# Patient Record
Sex: Male | Born: 2000 | Hispanic: No | Marital: Single
Health system: Southern US, Community
[De-identification: ages and names within clinical notes are randomized; demographics above are authoritative.]

## PROBLEM LIST (undated history)

## (undated) DIAGNOSIS — E669 Obesity, unspecified: Secondary | ICD-10-CM

## (undated) DIAGNOSIS — J45909 Unspecified asthma, uncomplicated: Secondary | ICD-10-CM

## (undated) HISTORY — DX: Obesity, unspecified: E66.9

---
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
## 2020-01-10 DIAGNOSIS — H5213 Myopia, bilateral: Secondary | ICD-10-CM | POA: Diagnosis not present

## 2020-03-06 DIAGNOSIS — Z23 Encounter for immunization: Secondary | ICD-10-CM | POA: Diagnosis not present

## 2020-03-27 DIAGNOSIS — Z23 Encounter for immunization: Secondary | ICD-10-CM | POA: Diagnosis not present

## 2020-09-03 ENCOUNTER — Other Ambulatory Visit: Payer: Self-pay

## 2020-09-03 ENCOUNTER — Other Ambulatory Visit (HOSPITAL_COMMUNITY)
Admission: RE | Admit: 2020-09-03 | Discharge: 2020-09-03 | Disposition: A | Discharge status: Home / Self Care | Payer: Medicaid Other | Source: Ambulatory Visit | Attending: Pediatrics | Admitting: Pediatrics

## 2020-09-03 ENCOUNTER — Ambulatory Visit (INDEPENDENT_AMBULATORY_CARE_PROVIDER_SITE_OTHER): Payer: Medicaid Other | Admitting: Pediatrics

## 2020-09-03 VITALS — BP 118/82 | HR 63 | Ht 66.0 in | Wt 196.6 lb

## 2020-09-03 DIAGNOSIS — E669 Obesity, unspecified: Secondary | ICD-10-CM | POA: Diagnosis not present

## 2020-09-03 DIAGNOSIS — E559 Vitamin D deficiency, unspecified: Secondary | ICD-10-CM | POA: Diagnosis not present

## 2020-09-03 DIAGNOSIS — Z114 Encounter for screening for human immunodeficiency virus [HIV]: Secondary | ICD-10-CM

## 2020-09-03 DIAGNOSIS — Z6831 Body mass index (BMI) 31.0-31.9, adult: Secondary | ICD-10-CM | POA: Diagnosis not present

## 2020-09-03 DIAGNOSIS — E661 Drug-induced obesity: Secondary | ICD-10-CM

## 2020-09-03 DIAGNOSIS — Z0001 Encounter for general adult medical examination with abnormal findings: Secondary | ICD-10-CM

## 2020-09-03 DIAGNOSIS — E6609 Other obesity due to excess calories: Secondary | ICD-10-CM | POA: Diagnosis not present

## 2020-09-03 DIAGNOSIS — R7401 Elevation of levels of liver transaminase levels: Secondary | ICD-10-CM

## 2020-09-03 DIAGNOSIS — Z23 Encounter for immunization: Secondary | ICD-10-CM

## 2020-09-03 DIAGNOSIS — E781 Pure hyperglyceridemia: Secondary | ICD-10-CM

## 2020-09-03 DIAGNOSIS — L309 Dermatitis, unspecified: Secondary | ICD-10-CM | POA: Diagnosis not present

## 2020-09-03 DIAGNOSIS — I10 Essential (primary) hypertension: Secondary | ICD-10-CM | POA: Diagnosis not present

## 2020-09-03 DIAGNOSIS — L83 Acanthosis nigricans: Secondary | ICD-10-CM | POA: Diagnosis not present

## 2020-09-03 DIAGNOSIS — Z113 Encounter for screening for infections with a predominantly sexual mode of transmission: Secondary | ICD-10-CM | POA: Diagnosis not present

## 2020-09-03 LAB — POCT RAPID HIV: Rapid HIV, POC: NEGATIVE

## 2020-09-03 MED ORDER — TRIAMCINOLONE ACETONIDE 0.1 % EX OINT: 1.0000 "application " | TOPICAL_OINTMENT | Freq: Two times a day (BID) | CUTANEOUS | 5 refills | Status: DC

## 2020-09-03 MED ORDER — MUPIROCIN 2 % EX OINT: 1.0000 "application " | TOPICAL_OINTMENT | Freq: Two times a day (BID) | CUTANEOUS | 1 refills | Status: DC

## 2020-09-03 NOTE — Progress Notes (Signed)
Adolescent Well Care Visit Justin [name] is a 19 y.o. male who is here for well care.    PCP:  Clifton Custard, MD   History was provided by the patient.  Current Issues: Current concerns include dry skin on hands, use lotion without improvement. No meds tried for this.  Hypertension - not taking lisinopril for the past year. 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[employer])  Nutrition/Exercise: Nutrition/Eating Behaviors: trying to eat smaller portions, doesn't eat many fruits/veggies, eating 3 times per day Supplements/ Vitamins: no Play any Sports?/ Exercise: going to the gym a few times each week  Sleep:  Sleep: sleeps well at night , denies snoring or night-time waking, sleeping during the day due to work  Social Screening: Lives with:  Mother, brother, and sister Parental relations:  good with mom, dad is not involed Activities, Work, and Regulatory affairs officer?: working at [employer] shift Concerns regarding behavior with peers?  no Stressors of note: no  Education: Not in school, graduated high school.  Thinking about community college or real estate classes.  Confidential Social History: Tobacco?  no Secondhand smoke exposure?  no Drugs/ETOH?  no  Sexually Active?  no   Pregnancy Prevention: abstinence - discussed condoms today  Screenings: Patient has a dental home: yes  The patient completed the Rapid Assessment for Adolescent Preventive Services screening questionnaire and the following topics were identified as risk factors and discussed: healthy eating  In addition, the following topics were discussed as part of anticipatory guidance healthy eating, exercise, tobacco use, marijuana use, drug use, condom use and school problems.  PHQ-9 completed and results indicated no signs of depression  Physical Exam:  Vitals:   09/03/20 1132  BP: 118/82  Pulse: 63  Weight: 196 lb 9.6 oz (89.2 kg)  Height: 5\' 6"  (1.676 m)   BP 118/82 (BP Location: Left Arm,  Patient Position: Sitting)   Pulse 63   Ht 5\' 6"  (1.676 m)   Wt 196 lb 9.6 oz (89.2 kg)   BMI 31.73 kg/m  Body mass index: body mass index is 31.73 kg/m.    Hearing Screening   Method: Audiometry   125Hz  250Hz  500Hz  1000Hz  2000Hz  3000Hz  4000Hz  6000Hz  8000Hz   Right ear:   20 20 20  20     Left ear:   20 20 20  20       Visual Acuity Screening   Right eye Left eye Both eyes  Without correction:     With correction: 20/16 20/16 20/16     General Appearance:   alert, oriented, no acute distress  HENT: Normocephalic, no obvious abnormality, conjunctiva clear  Mouth:   Normal appearing teeth, no obvious discoloration, dental caries, or dental caps  Neck:   Supple; thyroid: no enlargement, symmetric, no tenderness/mass/nodules  Chest Normal male  Lungs:   Clear to auscultation bilaterally, normal work of breathing  Heart:   Regular rate and rhythm, S1 and S2 normal, no murmurs;   Abdomen:   Soft, non-tender, no mass, or organomegaly  GU normal male genitals, no testicular masses or hernia, Tanner stage V  Musculoskeletal:   Tone and strength strong and symmetrical, all extremities               Lymphatic:   No cervical adenopathy  Skin/Hair/Nails:   Skin warm, dry and intact, no rashes, no bruises or petechiae, thickened hyperpigmented skin on posterior neck, erythematous dry and peeling patches on the dorsum of the left hand, milder dry pinking  patches on the right hand  Neurologic:   Strength, gait, and coordination normal and age-appropriate     Assessment and Plan:   1. Encounter for general adult medical examination with abnormal findings  2. Class 1 obesity due to excess calories with serious comorbidity and body mass index (BMI) of 31.0 to 31.9 in adult Patient with history of transaminitis likely due to fatty liver and hypertension.  Labs obtained to screen for additional obesity-related comorbidities.  BMI has decreased to 31.73 from 33 due to modest weight loss in the  setting of increased physical activity.   5-2-1-0 goals of healthy active living reviewed. - TSH - Hemoglobin A1c  3. Routine screening for STI (sexually transmitted infection) Patient denies sexual activity - at risk age group.  - POCT Rapid HIV - negative - Urine cytology ancillary only  4.Primary hypertension Diastolic BP is mildly elevated today but significantly improved from prior.  Will hold off on restarting meds today.  I provided dietary counseling - increase water intake, decrease caffeine intake, increase fruits/veggies, decrease sodium intake.  - TSH - Ambulatory referral to Internal Medicine  5. Acanthosis nigricans Noted again on exam.  Due for annual HgbA1C today. - Hemoglobin A1c - Ambulatory referral to Internal Medicine  7. Transaminitis History of elevated AST and ALT on last check 2 years ago - likely due to fatty liver.  Will repeat labs today - Comprehensive metabolic panel - Gamma GT  8. Hypovitaminosis D History of very low vitamin D level, no recent supplementation. - VITAMIN D 25 Hydroxy (Vit-D Deficiency, Fractures)  9. Hypertriglyceridemia Due for repeat fasting labs today.   - Lipid panel (fasting)  10. Eczema of left hand Discussed supportive care with hypoallergenic soap/detergent and regular application of bland emollients.  Reviewed appropriate use of steroid creams and return precautions. - triamcinolone ointment (KENALOG) 0.1 %; Apply 1 application topically 2 (two) times daily. For dry skin on hands.  Dispense: 60 g; Refill: 5 - mupirocin ointment (BACTROBAN) 2 %; Apply 1 application topically 2 (two) times daily. For skin infection  Dispense: 22 g; Refill: 1  Hearing screening result:normal Vision screening result: normal  Counseling provided for all of the vaccine components  Orders Placed This Encounter  Procedures  . Flu Vaccine QUAD 36+ mos IM     Return if symptoms worsen or fail to improve, for Will schedule new patient  appointment with [facility].Clifton Custard, MD

## 2020-09-03 NOTE — Patient Instructions (Signed)
@  CurDate@ @PATIENTFIRSTNAME @ @PATIENTLASTNAME @ 05-30-2001   Dear 04/25/2001,  As your medical provider, it is important to me that you continue to receive high-quality primary care services as you transition to adulthood.  After the age of 68, you can no longer be seen at the [facility] for your primary care health services.   Below is a list of adult medicine practices that are currently accepting new patients.  Please reach out to one of these practices to schedule a new patient appointment as soon as possible.  Please be aware that you will not be able to be seen at my office after your 22nd birthday.  Sincerely, [facility], MD  [facility]    Adult Primary Care Clinics Name Criteria Services   [facility]  Address: [address] [place], [address] [place]  Phone: [phone] Hours: Monday - Friday 9 AM -6 PM  Types of insurance accepted:  Saturday Saturday . [facility] (orange card) . Medicaid . Medicare . Uninsured  Language services:  Nurse, learning disability Video and phone interpreters available   Ages 58 and older    . Adult primary care . Onsite pharmacy . Integrated behavioral health . Financial assistance counseling . Walk-in hours for established patients  Financial assistance counseling hours: Tuesdays 2:00PM - 5:00PM  Thursday 8:30AM - 4:30PM  Space is limited, 10 on Tuesday and 20 on Thursday on a first come, first serve basis  Name Criteria Services   [facility]  Address: [address] [place], [address] [place]  Phone: [phone]  Hours: Monday - Friday 8:30 AM - 5 PM  Types of insurance accepted:  Saturday Thursday . Medicaid . Medicare . Uninsured  Language services:  Marland Kitchen Video and phone interpreters available   All ages - newborn to adult    . Primary care for all ages (children and adults) . Integrated behavioral health . Nutritionist . Financial assistance counseling

## 2020-09-04 LAB — LIPID PANEL
Cholesterol: 161 mg/dL (ref <170)
HDL: 43 mg/dL — ABNORMAL LOW (ref >45)
LDL Cholesterol (Calc): 90 mg/dL (calc) (ref <110)
Non-HDL Cholesterol (Calc): 118 mg/dL (calc) (ref <120)
Total CHOL/HDL Ratio: 3.7 (calc) (ref <5.0)
Triglycerides: 189 mg/dL — ABNORMAL HIGH (ref <90)

## 2020-09-04 LAB — COMPREHENSIVE METABOLIC PANEL
AG Ratio: 1.7 (calc) (ref 1.0–2.5)
ALT: 35 U/L (ref 8–46)
AST: 27 U/L (ref 12–32)
Albumin: 4.6 g/dL (ref 3.6–5.1)
Alkaline phosphatase (APISO): 88 U/L (ref 46–169)
BUN: 17 mg/dL (ref 7–20)
CO2: 25 mmol/L (ref 20–32)
Calcium: 9.3 mg/dL (ref 8.9–10.4)
Chloride: 104 mmol/L (ref 98–110)
Creat: 0.79 mg/dL (ref 0.60–1.26)
Globulin: 2.7 g/dL (calc) (ref 2.1–3.5)
Glucose, Bld: 83 mg/dL (ref 65–99)
Potassium: 4.4 mmol/L (ref 3.8–5.1)
Sodium: 140 mmol/L (ref 135–146)
Total Bilirubin: 0.3 mg/dL (ref 0.2–1.1)
Total Protein: 7.3 g/dL (ref 6.3–8.2)

## 2020-09-04 LAB — HEMOGLOBIN A1C
Hgb A1c MFr Bld: 5.2 % of total Hgb (ref <5.7)
Mean Plasma Glucose: 103 (calc)
eAG (mmol/L): 5.7 (calc)

## 2020-09-04 LAB — GAMMA GT: GGT: 25 U/L (ref 9–31)

## 2020-09-04 LAB — URINE CYTOLOGY ANCILLARY ONLY
Chlamydia: NEGATIVE
Comment: NEGATIVE
Comment: NORMAL
Neisseria Gonorrhea: NEGATIVE

## 2020-09-04 LAB — TSH: TSH: 3.34 mIU/L (ref 0.50–4.30)

## 2020-09-04 LAB — VITAMIN D 25 HYDROXY (VIT D DEFICIENCY, FRACTURES): Vit D, 25-Hydroxy: 10 ng/mL — ABNORMAL LOW (ref 30–100)

## 2020-09-09 ENCOUNTER — Other Ambulatory Visit: Payer: Self-pay | Admitting: Pediatrics

## 2020-09-09 DIAGNOSIS — E559 Vitamin D deficiency, unspecified: Secondary | ICD-10-CM

## 2020-09-09 MED ORDER — VITAMIN D (ERGOCALCIFEROL) 1.25 MG (50000 UNIT) PO CAPS: 50000.0000 [IU] | ORAL_CAPSULE | ORAL | 0 refills | Status: AC

## 2020-09-09 NOTE — Progress Notes (Signed)
Rx for weekly vitamin D supplementation.

## 2020-09-09 NOTE — Progress Notes (Signed)
I called and discussed Justin [name]'s lab results with him.  Vitamin D level is very low - Rx ergocalciferol 50,000 IU once weekly x 8 weeks and then start daily 1000 IU vitamin D after weekly supplementation.  Repeat vitamin D level with new adult PCP in 2-3 months.  Other labs are normal except for mildly low HDL and mildly elevated triglycerides - no change from prior.

## 2020-10-03 ENCOUNTER — Emergency Department (HOSPITAL_BASED_OUTPATIENT_CLINIC_OR_DEPARTMENT_OTHER)
Admission: EM | Admit: 2020-10-03 | Discharge: 2020-10-03 | Disposition: A | Discharge status: Home / Self Care | Payer: No Typology Code available for payment source | Attending: Emergency Medicine | Admitting: Emergency Medicine

## 2020-10-03 ENCOUNTER — Emergency Department (HOSPITAL_BASED_OUTPATIENT_CLINIC_OR_DEPARTMENT_OTHER): Payer: No Typology Code available for payment source

## 2020-10-03 ENCOUNTER — Encounter (HOSPITAL_BASED_OUTPATIENT_CLINIC_OR_DEPARTMENT_OTHER): Payer: Self-pay | Admitting: Emergency Medicine

## 2020-10-03 ENCOUNTER — Other Ambulatory Visit: Payer: Self-pay

## 2020-10-03 DIAGNOSIS — Z23 Encounter for immunization: Secondary | ICD-10-CM | POA: Diagnosis not present

## 2020-10-03 DIAGNOSIS — J45909 Unspecified asthma, uncomplicated: Secondary | ICD-10-CM | POA: Diagnosis not present

## 2020-10-03 DIAGNOSIS — Z7722 Contact with and (suspected) exposure to environmental tobacco smoke (acute) (chronic): Secondary | ICD-10-CM | POA: Diagnosis not present

## 2020-10-03 DIAGNOSIS — W231XXA Caught, crushed, jammed, or pinched between stationary objects, initial encounter: Secondary | ICD-10-CM | POA: Diagnosis not present

## 2020-10-03 DIAGNOSIS — Y990 Civilian activity done for income or pay: Secondary | ICD-10-CM | POA: Insufficient documentation

## 2020-10-03 DIAGNOSIS — S67193A Crushing injury of left middle finger, initial encounter: Secondary | ICD-10-CM | POA: Diagnosis not present

## 2020-10-03 DIAGNOSIS — S6710XA Crushing injury of unspecified finger(s), initial encounter: Secondary | ICD-10-CM

## 2020-10-03 MED ORDER — TETANUS-DIPHTH-ACELL PERTUSSIS 5-2.5-18.5 LF-MCG/0.5 IM SUSY
0.5000 mL | PREFILLED_SYRINGE | Freq: Once | INTRAMUSCULAR | Status: AC
  Administered 2020-10-03: 05:00:00 0.5 mL via INTRAMUSCULAR
  Filled 2020-10-03: qty 0.5

## 2020-10-03 MED ORDER — NAPROXEN 250 MG PO TABS
500.0000 mg | ORAL_TABLET | Freq: Once | ORAL | Status: AC
  Administered 2020-10-03: 05:00:00 500 mg via ORAL
  Filled 2020-10-03: qty 2

## 2020-10-03 NOTE — ED Provider Notes (Signed)
[facility] Provider Note: Lowella Dell, MD, FACEP  CSN: [id number] MRN: [id number] ARRIVAL: 10/03/20 at 0352 ROOM: MH01/MH01   CHIEF COMPLAINT  Finger Injury   HISTORY OF PRESENT ILLNESS  10/03/20 4:05 AM Justin [name] is a 19 y.o. male who works at [employer].  He got his left middle finger crushed between 2 pieces of metal at work just prior to arrival.  He is having pain in his left middle finger distal phalanx.  He rates the pain as a 5 out of 10, worse with palpation or movement.  There is an abrasion to the pad of the affected finger.  Sensation is intact in the fingertip and he is able to flex and extend the finger at the DIP and PIP joints.  Tetanus is not up-to-date.   Past Medical History:  Diagnosis Date   Asthma    Obesity, unspecified 06/05/2014    History reviewed. No pertinent surgical history.  No family history on file.  Social History   Tobacco Use   Smoking status: Passive Smoke Exposure - Never Smoker   Smokeless tobacco: Never Used  Substance Use Topics   Alcohol use: No   Drug use: No    Prior to Admission medications   Medication Sig Start Date End Date Taking? Authorizing Provider  mupirocin ointment (BACTROBAN) 2 % Apply 1 application topically 2 (two) times daily. For skin infection 09/03/20   Ettefagh, Aron Baba, MD  Vitamin D, Ergocalciferol, (DRISDOL) 1.25 MG (50000 UNIT) CAPS capsule Take 1 capsule (50,000 Units total) by mouth once a week. 09/09/20   Ettefagh, Aron Baba, MD    Allergies Patient has no known allergies.   REVIEW OF SYSTEMS  Negative except as noted here or in the History of Present Illness.   PHYSICAL EXAMINATION  Initial Vital Signs Blood pressure (!) 165/81, pulse 65, temperature 98.4 F (36.9 C), temperature source Oral, resp. rate 18, height 5\' 5"  (1.651 m), weight 88.5 kg, SpO2 99 %.  Examination General: Well-developed, well-nourished male in no acute distress; appearance consistent with age  of record HENT: normocephalic; atraumatic Eyes: Normal appearance Neck: supple Heart: regular rate and rhythm Lungs: clear to auscultation bilaterally Abdomen: soft; nondistended; nontender; bowel sounds present Extremities: No deformity; full range of motion; tenderness and volar abrasion of distal phalanx of left middle finger, finger pad soft, sensation and tendon function intact Neurologic: Awake, alert and oriented; motor function intact in all extremities and symmetric; no facial droop Skin: Warm and dry Psychiatric: Normal mood and affect   RESULTS  Summary of this visit's results, reviewed and interpreted by myself:   EKG Interpretation  Date/Time:    Ventricular Rate:    PR Interval:    QRS Duration:   QT Interval:    QTC Calculation:   R Axis:     Text Interpretation:        Laboratory Studies: No results found for this or any previous visit (from the past 24 hour(s)). Imaging Studies: DG Finger Middle Left  Result Date: 10/03/2020 CLINICAL DATA:  Crushing injury of the left middle finger EXAM: LEFT MIDDLE FINGER 2+V COMPARISON:  None. FINDINGS: Mild soft tissue swelling of the third digit most pronounced along the dorsal aspect of the distal interphalangeal joint. No soft tissue gas or foreign body. No acute bony abnormality. Specifically, no fracture, subluxation, or dislocation. IMPRESSION: Mild soft tissue swelling, most focally adjacent the dorsal aspect of the distal third interphalangeal joint. No acute osseous abnormality. Electronically Signed  By: Kreg Shropshire M.D.   On: 10/03/2020 04:15    ED COURSE and MDM  Nursing notes, initial and subsequent vitals signs, including pulse oximetry, reviewed and interpreted by myself.  Vitals:   10/03/20 0400  BP: (!) 165/81  Pulse: 65  Resp: 18  Temp: 98.4 F (36.9 C)  TempSrc: Oral  SpO2: 99%  Weight: 88.5 kg  Height: 5\' 5"  (1.651 m)   Medications  Tdap (BOOSTRIX) injection 0.5 mL (has no administration  in time range)  naproxen (NAPROSYN) tablet 500 mg (has no administration in time range)   Will treat with local wound care.  No evidence of fracture on radiograph or compartment syndrome on examination.  Tetanus updated.   PROCEDURES  Procedures   ED DIAGNOSES     ICD-10-CM   1. Crushing injury of finger, initial encounter  S67.10XA        Tauri Ethington, , MD 10/03/20 [phone]

## 2020-10-03 NOTE — ED Triage Notes (Signed)
Crush type injury to left middle finger by metal machine at work. Pt has ice on finger on arrival.

## 2020-10-27 ENCOUNTER — Other Ambulatory Visit: Payer: Self-pay | Admitting: Pediatrics

## 2020-10-27 DIAGNOSIS — E559 Vitamin D deficiency, unspecified: Secondary | ICD-10-CM

## 2020-10-29 ENCOUNTER — Ambulatory Visit (INDEPENDENT_AMBULATORY_CARE_PROVIDER_SITE_OTHER): Payer: Medicaid Other | Admitting: Pediatrics

## 2020-10-29 ENCOUNTER — Encounter: Payer: Self-pay | Admitting: Pediatrics

## 2020-10-29 ENCOUNTER — Other Ambulatory Visit: Payer: Self-pay

## 2020-10-29 VITALS — BP 128/80 | HR 67 | Temp 97.2°F | Ht 65.0 in | Wt 191.8 lb

## 2020-10-29 DIAGNOSIS — J018 Other acute sinusitis: Secondary | ICD-10-CM

## 2020-10-29 DIAGNOSIS — R509 Fever, unspecified: Secondary | ICD-10-CM | POA: Diagnosis not present

## 2020-10-29 LAB — POC SOFIA SARS ANTIGEN FIA: SARS:: NEGATIVE

## 2020-10-29 MED ORDER — AMOXICILLIN 875 MG PO TABS
875.0000 mg | ORAL_TABLET | Freq: Two times a day (BID) | ORAL | 0 refills | Status: AC
Start: 2020-10-29 — End: 2020-11-05
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
## 2020-10-29 NOTE — Patient Instructions (Signed)

## 2020-11-30 ENCOUNTER — Other Ambulatory Visit: Payer: Self-pay

## 2020-11-30 ENCOUNTER — Ambulatory Visit (INDEPENDENT_AMBULATORY_CARE_PROVIDER_SITE_OTHER): Payer: 59

## 2020-11-30 DIAGNOSIS — Z23 Encounter for immunization: Secondary | ICD-10-CM

## 2020-11-30 NOTE — Progress Notes (Signed)
   Covid-19 Vaccination Clinic  Name:  Justin [name]    MRN: [id number] DOB: 10-05-2001  11/30/2020  Mr. [name] was observed post Covid-19 immunization for 15 minutes without incident. He was provided with Vaccine Information Sheet and instruction to access the V-Safe system.   Mr. [name] was instructed to call 911 with any severe reactions post vaccine: Marland Kitchen Difficulty breathing  . Swelling of face and throat  . A fast heartbeat  . A bad rash all over body  . Dizziness and weakness   Immunizations Administered    Name Date Dose VIS Date Route   PFIZER Comrnaty(Gray TOP) Covid-19 Vaccine 11/30/2020  9:30 AM 0.3 mL 09/19/2020 Intramuscular   Manufacturer: [employer], [employer]   Lot: [id number]   NDC: [phone]

## 2021-01-30 DIAGNOSIS — H538 Other visual disturbances: Secondary | ICD-10-CM | POA: Diagnosis not present

## 2022-05-20 IMAGING — DX DG FINGER MIDDLE 2+V*L*
3 series · 3 of 3 positions shown · non-contrast
Comparison: None.

CLINICAL DATA: Crushing injury of the left middle finger

EXAM:
LEFT MIDDLE FINGER 2+V

[finger ap]
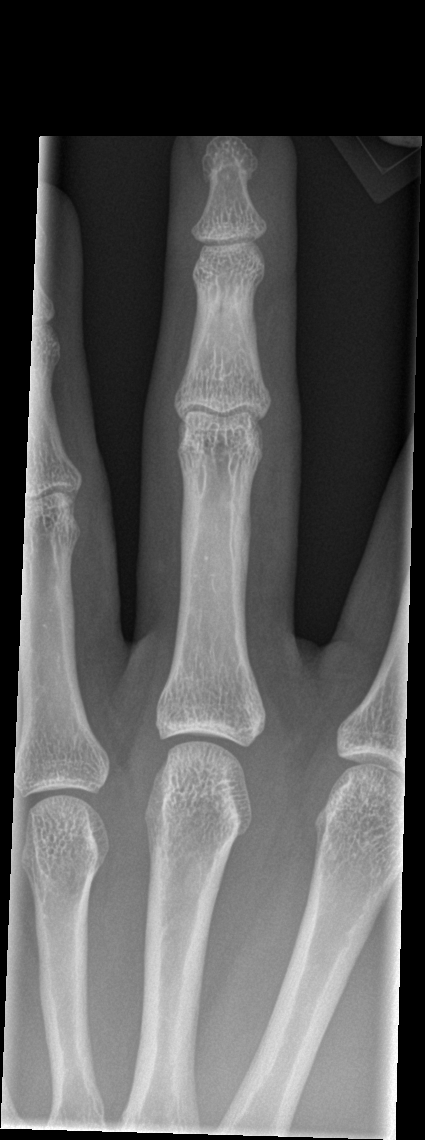

[finger obl]
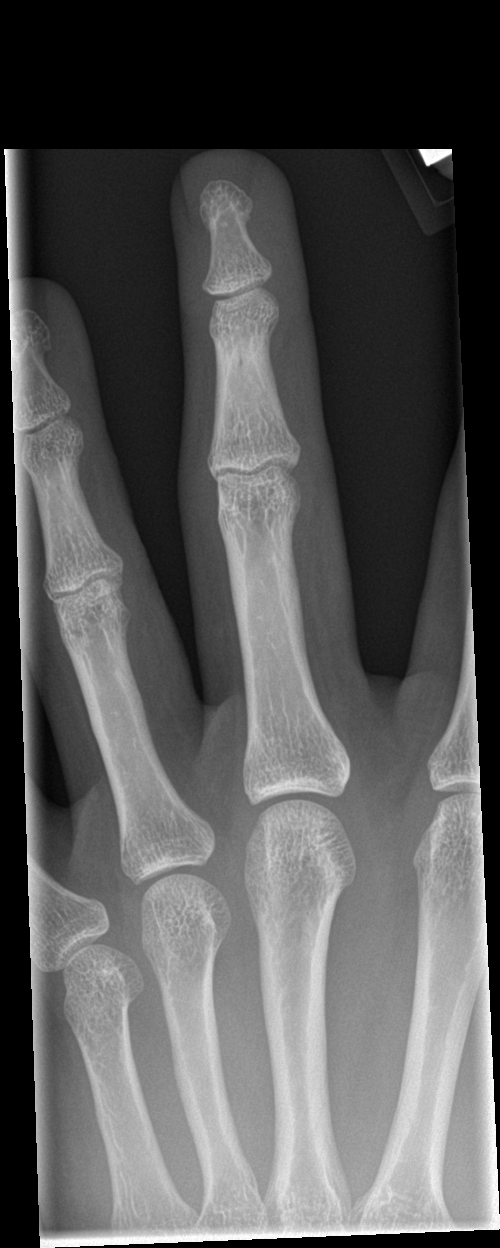

[finger lat]
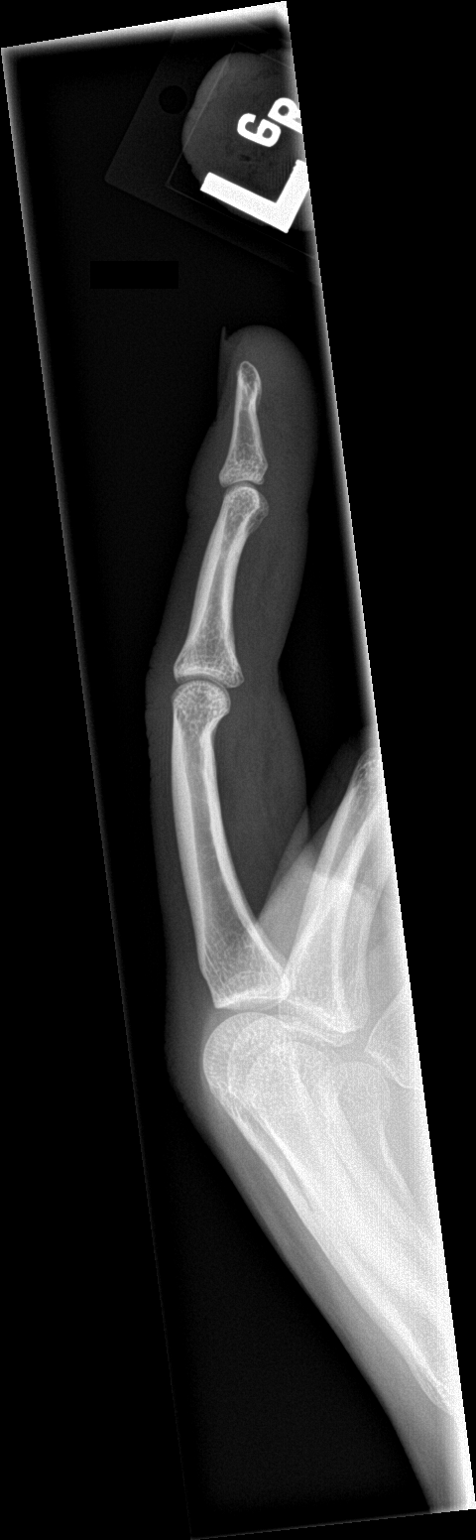

[3 of 3 positions shown; findings below may reference images not displayed]

FINDINGS: Mild soft tissue swelling of the third digit most pronounced along
the dorsal aspect of the distal interphalangeal joint. No soft
tissue gas or foreign body. No acute bony abnormality. Specifically,
no fracture, subluxation, or dislocation.
IMPRESSION: Mild soft tissue swelling, most focally adjacent the dorsal aspect
of the distal third interphalangeal joint. No acute osseous
abnormality.

## 2023-02-03 ENCOUNTER — Telehealth: Payer: Self-pay

## 2023-02-03 NOTE — Telephone Encounter (Signed)
LVM for patient to call back to schedule apt. AS, CMA 

## 2024-08-09 ENCOUNTER — Ambulatory Visit (HOSPITAL_COMMUNITY)
Admission: EM | Admit: 2024-08-09 | Discharge: 2024-08-09 | Disposition: A | Discharge status: Home / Self Care | Attending: Emergency Medicine | Admitting: Emergency Medicine

## 2024-08-09 ENCOUNTER — Encounter (HOSPITAL_COMMUNITY): Payer: Self-pay

## 2024-08-09 DIAGNOSIS — L0211 Cutaneous abscess of neck: Secondary | ICD-10-CM | POA: Diagnosis not present

## 2024-08-09 DIAGNOSIS — L03221 Cellulitis of neck: Secondary | ICD-10-CM

## 2024-08-09 MED ORDER — DOXYCYCLINE HYCLATE 100 MG PO CAPS: 100.0000 mg | ORAL_CAPSULE | Freq: Two times a day (BID) | ORAL | 0 refills | Status: DC
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
## 2024-08-09 NOTE — ED Triage Notes (Signed)
 Patient here today with c/o an abscess on the left side of his neck and on the back of his neck X 2.5 weeks. Patient states that the boil on the left side of his neck popped this morning. Patient denies pain.

## 2024-08-09 NOTE — Discharge Instructions (Addendum)
 Start taking doxycycline twice daily for 10 days. Apply warm compress (warm wet washcloth) to each of the areas to help reduce swelling and promote drainage. Return here if the areas become larger or more painful.  If you develop significantly increased swelling, fever, body aches, chills, or weakness please seek immediate medical treatment in the emergency department. Follow-up with primary care provider as needed.

## 2024-10-20 ENCOUNTER — Ambulatory Visit (HOSPITAL_COMMUNITY)
Admission: EM | Admit: 2024-10-20 | Discharge: 2024-10-20 | Disposition: A | Discharge status: Home / Self Care | Attending: Emergency Medicine | Admitting: Emergency Medicine

## 2024-10-20 ENCOUNTER — Encounter (HOSPITAL_COMMUNITY): Payer: Self-pay

## 2024-10-20 DIAGNOSIS — L83 Acanthosis nigricans: Secondary | ICD-10-CM | POA: Diagnosis not present

## 2024-10-20 DIAGNOSIS — L0291 Cutaneous abscess, unspecified: Secondary | ICD-10-CM

## 2024-10-20 LAB — GLUCOSE, POCT (MANUAL RESULT ENTRY): POCT Glucose (KUC): 102 mg/dL — AB (ref 70–99)

## 2024-10-20 MED ORDER — SULFAMETHOXAZOLE-TRIMETHOPRIM 800-160 MG PO TABS: 1.0000 | ORAL_TABLET | Freq: Two times a day (BID) | ORAL | 0 refills | Status: AC

## 2024-10-20 MED ORDER — LIDOCAINE-EPINEPHRINE 1 %-1:100000 IJ SOLN
INTRAMUSCULAR | Status: AC
  Filled 2024-10-20: qty 1

## 2024-10-20 MED ORDER — CHLORHEXIDINE GLUCONATE 4 % EX SOLN: Freq: Every day | CUTANEOUS | 0 refills | Status: AC

## 2024-10-20 NOTE — ED Triage Notes (Signed)
 Patient presenting with a mass on the back of the neck. States was seen for a mass on the side of his neck similar to this one and was diagnosed as a abscess then drained. States the mass now on the back of the neck onset 4 months small but has gotten larger and painful.   Patient also has a mass on the mid buttocks onset 1 week ago. States the area is no longer painful though.

## 2024-10-20 NOTE — Discharge Instructions (Signed)
 Use warm compresses on your other neck and buttock lesions to help them resolve  Use antibiotic ointment and a bandage to your incision until is it completely healed.   Instead of the chlorhexidine  soap -washing daily for a week - another option is to take bleach baths for 3 months per instructions below:   Dilute bleach baths (one teaspoon bleach per gallon of water, or one-fourth cup bleach per one-fourth tub [approximately 13 gallons of water] for 15 minutes twice weekly) for approximately three months
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
